# Patient Record
Sex: Female | Born: 1935 | Race: White | Hispanic: No | Smoking: Never smoker
Health system: Southern US, Community
[De-identification: ages and names within clinical notes are randomized; demographics above are authoritative.]

## PROBLEM LIST (undated history)

## (undated) DIAGNOSIS — M1990 Unspecified osteoarthritis, unspecified site: Secondary | ICD-10-CM

## (undated) DIAGNOSIS — Z923 Personal history of irradiation: Secondary | ICD-10-CM

## (undated) DIAGNOSIS — I10 Essential (primary) hypertension: Secondary | ICD-10-CM

## (undated) DIAGNOSIS — C50919 Malignant neoplasm of unspecified site of unspecified female breast: Secondary | ICD-10-CM

## (undated) DIAGNOSIS — E785 Hyperlipidemia, unspecified: Secondary | ICD-10-CM

## (undated) DIAGNOSIS — C801 Malignant (primary) neoplasm, unspecified: Secondary | ICD-10-CM

## (undated) DIAGNOSIS — F0390 Unspecified dementia without behavioral disturbance: Secondary | ICD-10-CM

## (undated) DIAGNOSIS — Z853 Personal history of malignant neoplasm of breast: Secondary | ICD-10-CM

## (undated) DIAGNOSIS — E079 Disorder of thyroid, unspecified: Secondary | ICD-10-CM

## (undated) DIAGNOSIS — G459 Transient cerebral ischemic attack, unspecified: Secondary | ICD-10-CM

## (undated) DIAGNOSIS — Z9221 Personal history of antineoplastic chemotherapy: Secondary | ICD-10-CM

## (undated) DIAGNOSIS — D6862 Lupus anticoagulant syndrome: Secondary | ICD-10-CM

## (undated) DIAGNOSIS — I639 Cerebral infarction, unspecified: Secondary | ICD-10-CM

## (undated) HISTORY — DX: Hyperlipidemia, unspecified: E78.5

## (undated) HISTORY — DX: Cerebral infarction, unspecified: I63.9

## (undated) HISTORY — DX: Essential (primary) hypertension: I10

## (undated) HISTORY — PX: FINGER HARDWARE REMOVAL: SHX1637

## (undated) HISTORY — DX: Personal history of malignant neoplasm of breast: Z85.3

## (undated) HISTORY — DX: Unspecified dementia, unspecified severity, without behavioral disturbance, psychotic disturbance, mood disturbance, and anxiety: F03.90

## (undated) HISTORY — DX: Transient cerebral ischemic attack, unspecified: G45.9

## (undated) HISTORY — DX: Lupus anticoagulant syndrome: D68.62

## (undated) HISTORY — DX: Disorder of thyroid, unspecified: E07.9

## (undated) HISTORY — PX: REPLACEMENT TOTAL KNEE: SUR1224

## (undated) HISTORY — DX: Malignant (primary) neoplasm, unspecified: C80.1

## (undated) HISTORY — DX: Unspecified osteoarthritis, unspecified site: M19.90

---

## 1964-06-12 HISTORY — PX: THYROID SURGERY: SHX805

## 1967-06-13 HISTORY — PX: ABDOMINAL HYSTERECTOMY: SHX81

## 1999-06-13 DIAGNOSIS — C801 Malignant (primary) neoplasm, unspecified: Secondary | ICD-10-CM

## 1999-06-13 HISTORY — DX: Malignant (primary) neoplasm, unspecified: C80.1

## 1999-06-13 HISTORY — PX: BREAST SURGERY: SHX581

## 1999-06-13 HISTORY — PX: BREAST LUMPECTOMY: SHX2

## 1999-06-30 DIAGNOSIS — C50919 Malignant neoplasm of unspecified site of unspecified female breast: Secondary | ICD-10-CM

## 1999-06-30 HISTORY — DX: Malignant neoplasm of unspecified site of unspecified female breast: C50.919

## 2004-04-13 ENCOUNTER — Ambulatory Visit: Payer: Self-pay | Admitting: Oncology

## 2004-05-16 ENCOUNTER — Ambulatory Visit: Payer: Self-pay | Admitting: General Surgery

## 2004-11-09 ENCOUNTER — Ambulatory Visit: Payer: Self-pay | Admitting: Oncology

## 2005-05-10 ENCOUNTER — Ambulatory Visit: Payer: Self-pay | Admitting: Oncology

## 2005-05-12 ENCOUNTER — Ambulatory Visit: Payer: Self-pay | Admitting: Oncology

## 2005-05-19 ENCOUNTER — Ambulatory Visit: Payer: Self-pay | Admitting: General Surgery

## 2005-05-30 ENCOUNTER — Ambulatory Visit: Payer: Self-pay | Admitting: General Surgery

## 2005-11-07 ENCOUNTER — Ambulatory Visit: Payer: Self-pay | Admitting: Oncology

## 2005-11-10 ENCOUNTER — Ambulatory Visit: Payer: Self-pay | Admitting: Oncology

## 2006-05-08 ENCOUNTER — Ambulatory Visit: Payer: Self-pay | Admitting: Oncology

## 2006-05-12 ENCOUNTER — Ambulatory Visit: Payer: Self-pay | Admitting: Oncology

## 2006-05-14 ENCOUNTER — Ambulatory Visit: Payer: Self-pay | Admitting: General Surgery

## 2006-10-11 ENCOUNTER — Ambulatory Visit: Payer: Self-pay | Admitting: Oncology

## 2006-10-30 ENCOUNTER — Ambulatory Visit: Payer: Self-pay | Admitting: Oncology

## 2006-11-11 ENCOUNTER — Ambulatory Visit: Payer: Self-pay | Admitting: Oncology

## 2007-04-13 ENCOUNTER — Ambulatory Visit: Payer: Self-pay | Admitting: Oncology

## 2007-05-06 ENCOUNTER — Ambulatory Visit: Payer: Self-pay | Admitting: Oncology

## 2007-05-13 ENCOUNTER — Ambulatory Visit: Payer: Self-pay | Admitting: Oncology

## 2007-05-13 ENCOUNTER — Ambulatory Visit: Payer: Self-pay | Admitting: General Surgery

## 2007-06-13 HISTORY — PX: FINGER SURGERY: SHX640

## 2007-06-13 HISTORY — PX: WRIST SURGERY: SHX841

## 2007-11-11 ENCOUNTER — Ambulatory Visit: Payer: Self-pay | Admitting: Oncology

## 2007-12-05 ENCOUNTER — Inpatient Hospital Stay: Payer: Self-pay | Admitting: Specialist

## 2007-12-05 ENCOUNTER — Other Ambulatory Visit: Payer: Self-pay

## 2007-12-10 ENCOUNTER — Ambulatory Visit: Payer: Self-pay | Admitting: Oncology

## 2007-12-11 ENCOUNTER — Ambulatory Visit: Payer: Self-pay | Admitting: Oncology

## 2008-01-11 ENCOUNTER — Ambulatory Visit: Payer: Self-pay | Admitting: Oncology

## 2008-01-20 ENCOUNTER — Ambulatory Visit: Payer: Self-pay | Admitting: Oncology

## 2008-02-11 ENCOUNTER — Ambulatory Visit: Payer: Self-pay | Admitting: Oncology

## 2008-05-12 ENCOUNTER — Ambulatory Visit: Payer: Self-pay | Admitting: Oncology

## 2008-05-13 ENCOUNTER — Ambulatory Visit: Payer: Self-pay | Admitting: General Surgery

## 2008-05-19 ENCOUNTER — Ambulatory Visit: Payer: Self-pay | Admitting: Oncology

## 2008-05-25 ENCOUNTER — Ambulatory Visit: Payer: Self-pay | Admitting: General Surgery

## 2008-06-12 ENCOUNTER — Ambulatory Visit: Payer: Self-pay | Admitting: Oncology

## 2008-06-12 HISTORY — PX: EYE SURGERY: SHX253

## 2008-10-10 ENCOUNTER — Ambulatory Visit: Payer: Self-pay | Admitting: Oncology

## 2008-11-03 ENCOUNTER — Ambulatory Visit: Payer: Self-pay | Admitting: Oncology

## 2008-11-10 ENCOUNTER — Ambulatory Visit: Payer: Self-pay | Admitting: Oncology

## 2009-01-09 ENCOUNTER — Inpatient Hospital Stay: Payer: Self-pay | Admitting: Internal Medicine

## 2009-01-26 ENCOUNTER — Ambulatory Visit: Payer: Self-pay | Admitting: Oncology

## 2009-02-10 ENCOUNTER — Ambulatory Visit: Payer: Self-pay | Admitting: Oncology

## 2009-04-12 ENCOUNTER — Ambulatory Visit: Payer: Self-pay | Admitting: Oncology

## 2009-04-20 ENCOUNTER — Ambulatory Visit: Payer: Self-pay | Admitting: Oncology

## 2009-05-12 ENCOUNTER — Ambulatory Visit: Payer: Self-pay | Admitting: Oncology

## 2009-06-12 HISTORY — PX: COLONOSCOPY: SHX174

## 2009-06-17 ENCOUNTER — Ambulatory Visit: Payer: Self-pay | Admitting: General Surgery

## 2009-07-07 ENCOUNTER — Ambulatory Visit: Payer: Self-pay | Admitting: Gastroenterology

## 2009-07-11 ENCOUNTER — Emergency Department: Payer: Self-pay | Admitting: Unknown Physician Specialty

## 2009-10-10 ENCOUNTER — Ambulatory Visit: Payer: Self-pay | Admitting: Oncology

## 2009-10-21 ENCOUNTER — Emergency Department: Payer: Self-pay | Admitting: Emergency Medicine

## 2009-11-02 ENCOUNTER — Ambulatory Visit: Payer: Self-pay | Admitting: Oncology

## 2009-11-10 ENCOUNTER — Ambulatory Visit: Payer: Self-pay | Admitting: Oncology

## 2010-05-04 ENCOUNTER — Ambulatory Visit: Payer: Self-pay | Admitting: Oncology

## 2010-05-06 LAB — CANCER ANTIGEN 27.29: CA 27.29: 15.7 U/mL (ref 0.0–38.6)

## 2010-05-12 ENCOUNTER — Ambulatory Visit: Payer: Self-pay | Admitting: Oncology

## 2010-05-15 ENCOUNTER — Ambulatory Visit: Payer: Self-pay | Admitting: Internal Medicine

## 2010-05-16 ENCOUNTER — Ambulatory Visit: Payer: Self-pay | Admitting: Oncology

## 2010-06-12 HISTORY — PX: SHOULDER SURGERY: SHX246

## 2010-06-23 ENCOUNTER — Ambulatory Visit: Payer: Self-pay | Admitting: Oncology

## 2010-07-29 IMAGING — CT CT HEAD WITHOUT CONTRAST
2 series · 16 of 30 positions shown, 20 images · non-contrast
Comparison: none

REASON FOR EXAM: facial drooping/headache..pt in [place]
COMMENTS:   LMP: Post Hysterectomy

PROCEDURE:     CT  - CT HEAD WITHOUT CONTRAST  - October 21, 2009  [DATE]
RESULT:     Head CT dated 10/21/2009 comparison made to prior study dated
07/12/2009.
TECHNIQUE: Helical 5 mm sections were obtained from the skull base to the
vertex without administration of intravenous contrast.

[Series 2: without · axial · non-contrast · 0.39mm/px · z∈[-156,-36]mm · 13 of 29 slices shown, 17 images]
[im 3/29  brain]
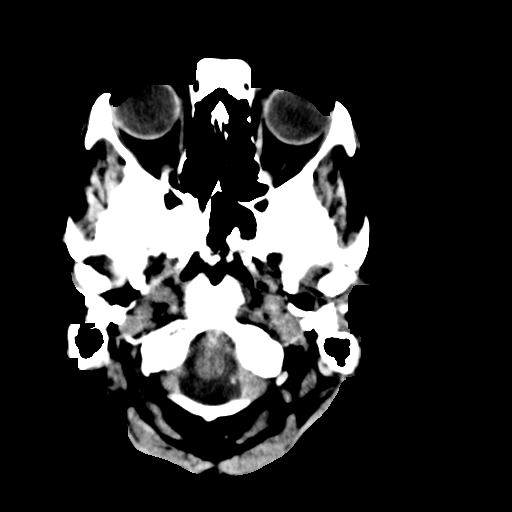
[im 3/29  bone]
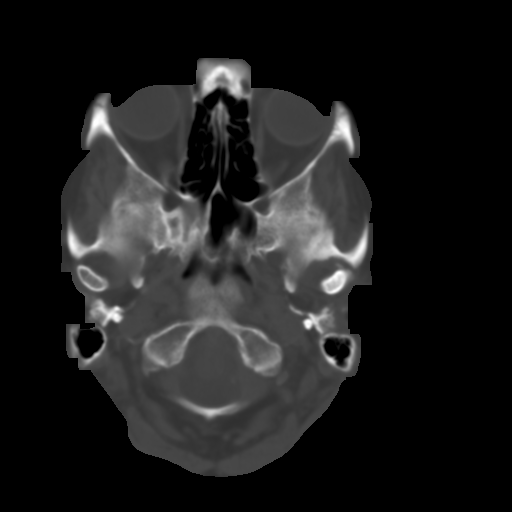
[im 5/29  brain]
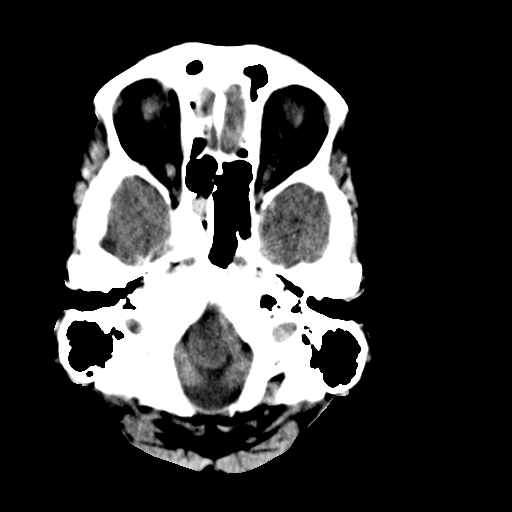
[im 7/29  brain]
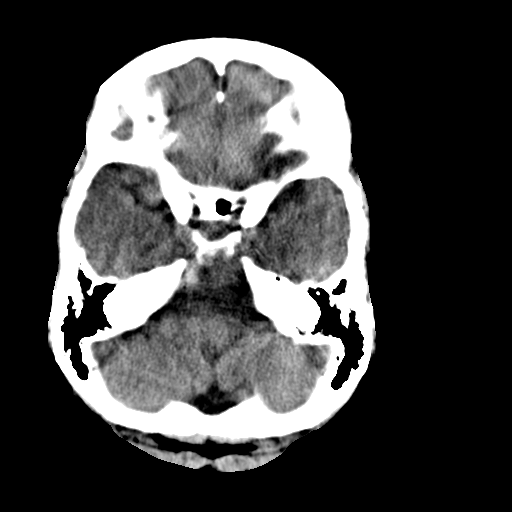
[im 9/29  brain]
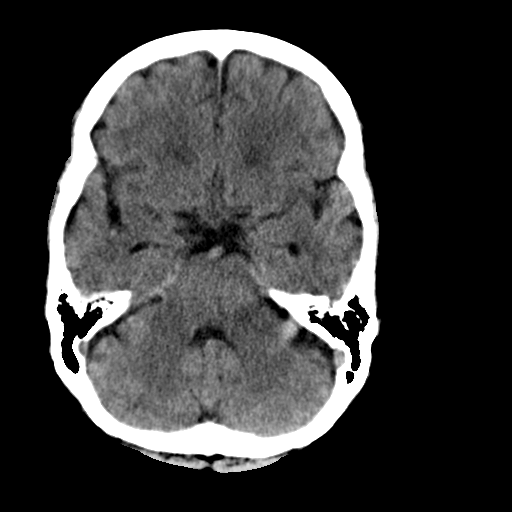
[im 11/29  brain]
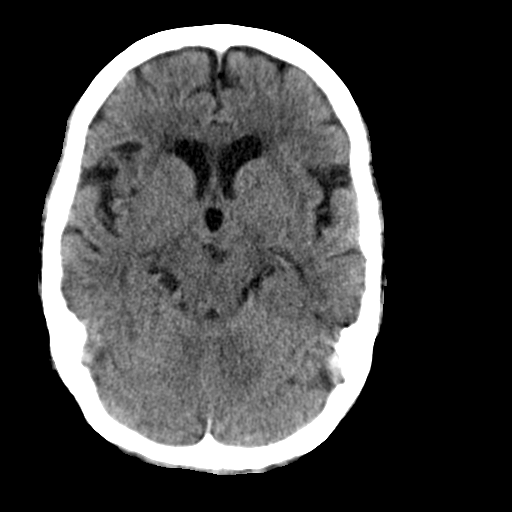
[im 11/29  bone]
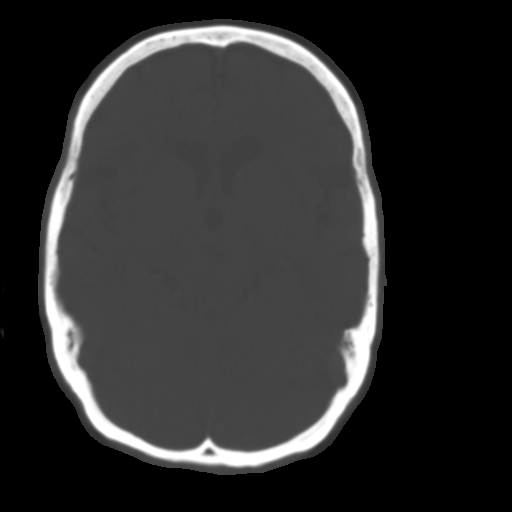
[im 13/29  brain]
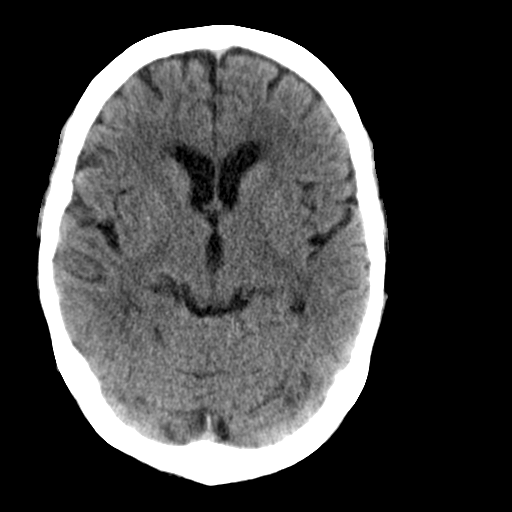
[im 15/29  brain]
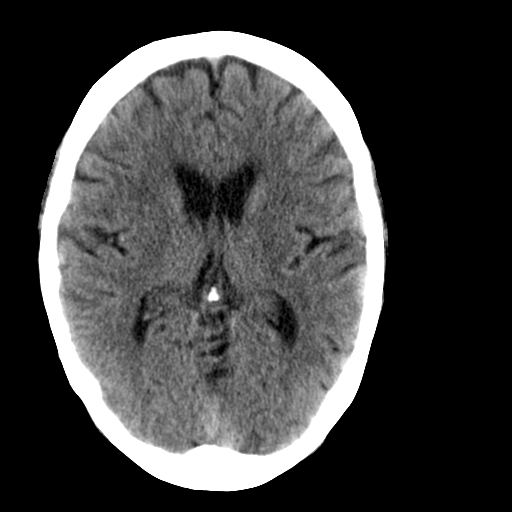
[im 17/29  brain]
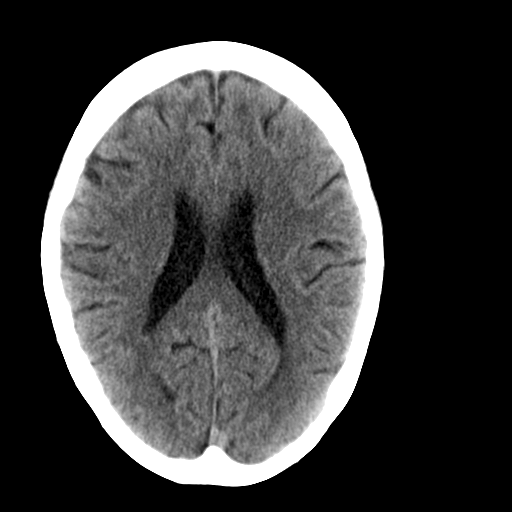
[im 19/29  brain]
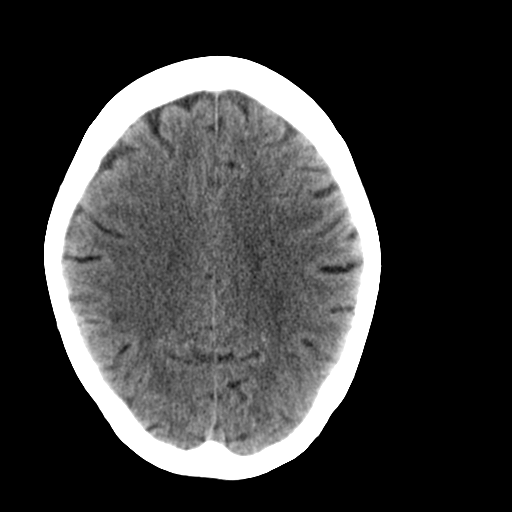
[im 19/29  bone]
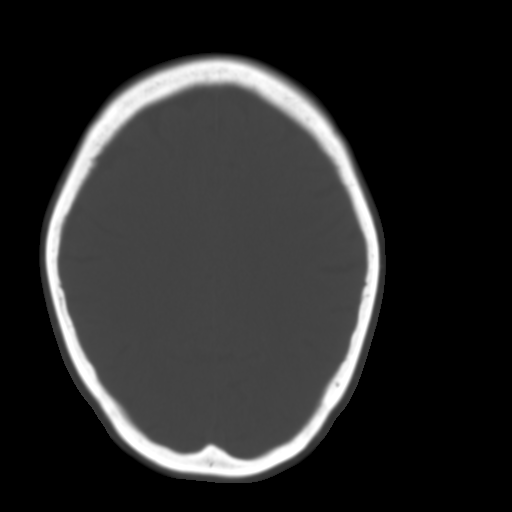
[im 21/29  brain]
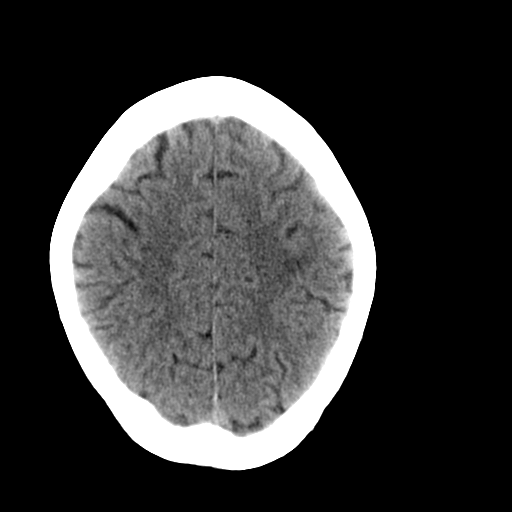
[im 23/29  brain]
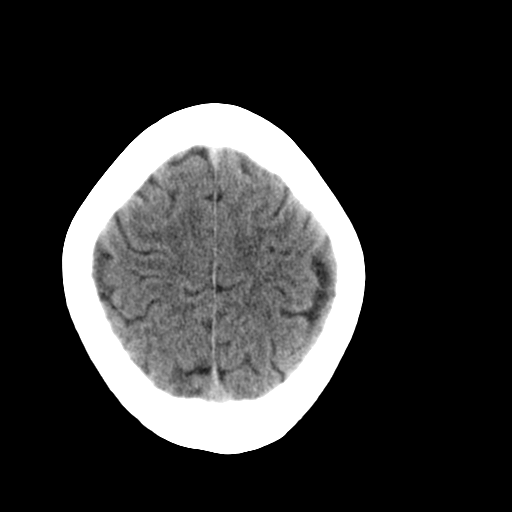
[im 25/29  brain]
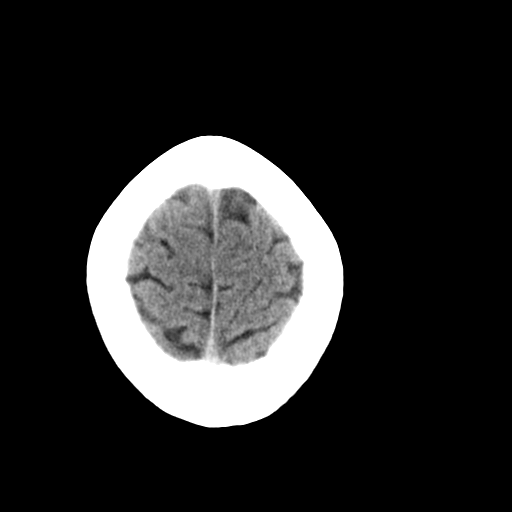
[im 27/29  brain]
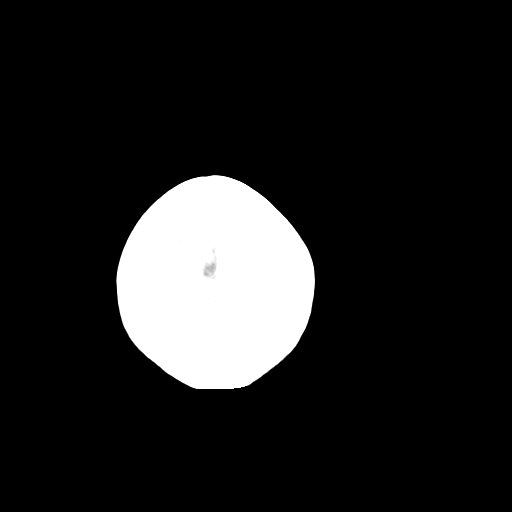
[im 27/29  bone]
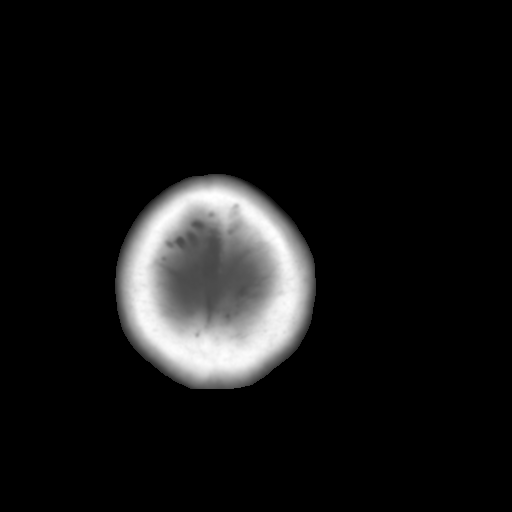

[Series 3: bone · axial · 0.39mm/px · z∈[-156,-116]mm · 3 of 29 slices shown]
[im 3/29  bone]
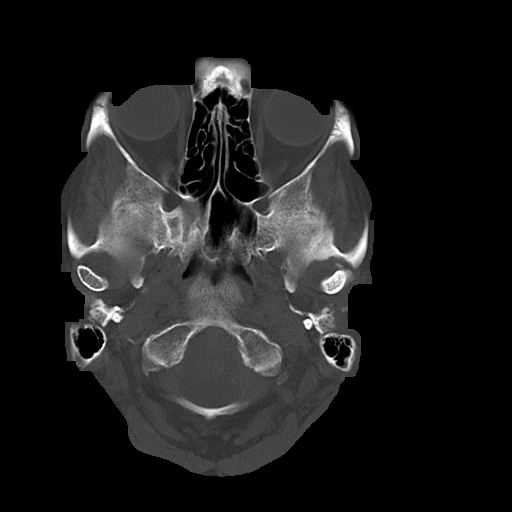
[im 7/29  bone]
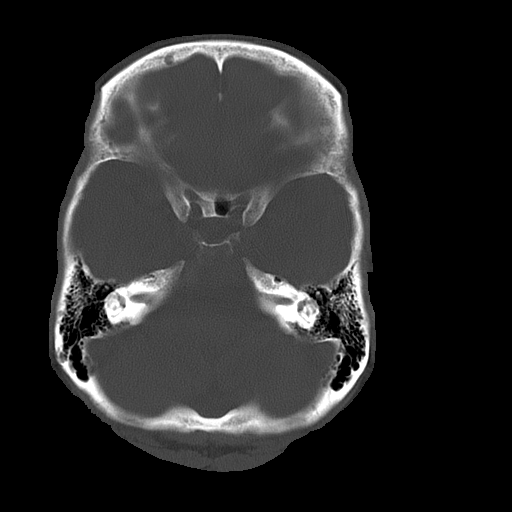
[im 11/29  bone]
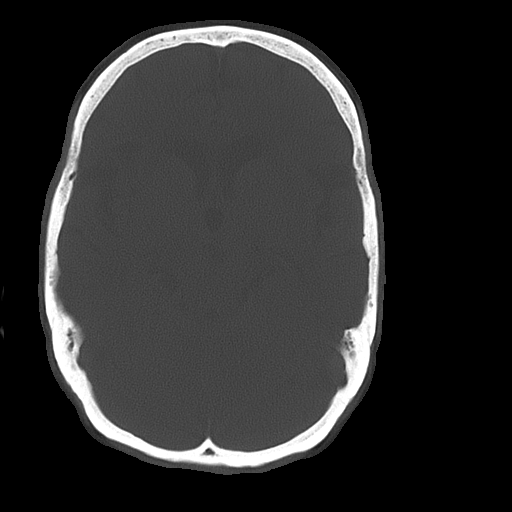

[16 of 30 positions shown; findings below may reference images not displayed]

FINDINGS: There is no evidence of intra-axial nor extra-axial fluid
collections nor evidence of acute hemorrhage. Areas of low attenuation
project within the subcortical, deep and periventricular white matter
regions. There is no evidence of subfalcine or tonsillar herniation. The
ventricles and cisterns are patent. There is no evidence of a depressed
skull fracture.
IMPRESSION: Involutional changes without evidence of focal or acute
abnormalities.

## 2011-01-18 ENCOUNTER — Ambulatory Visit: Payer: Self-pay | Admitting: Rheumatology

## 2011-05-10 ENCOUNTER — Ambulatory Visit: Payer: Self-pay | Admitting: Oncology

## 2011-06-13 DIAGNOSIS — I639 Cerebral infarction, unspecified: Secondary | ICD-10-CM

## 2011-06-13 HISTORY — DX: Cerebral infarction, unspecified: I63.9

## 2011-06-21 ENCOUNTER — Emergency Department: Payer: Self-pay | Admitting: Emergency Medicine

## 2011-06-21 LAB — CBC WITH DIFFERENTIAL/PLATELET
Basophil %: 0.4 %
Eosinophil %: 1.4 %
HGB: 12 g/dL (ref 12.0–16.0)
Lymphocyte #: 1.2 10*3/uL (ref 1.0–3.6)
Lymphocyte %: 16.1 %
MCV: 84 fL (ref 80–100)
Monocyte #: 0.9 10*3/uL — ABNORMAL HIGH (ref 0.0–0.7)
Monocyte %: 12.3 %
Neutrophil #: 5.2 10*3/uL (ref 1.4–6.5)
Neutrophil %: 69.8 %
Platelet: 208 10*3/uL (ref 150–440)
RBC: 4.37 10*6/uL (ref 3.80–5.20)
RDW: 14.4 % (ref 11.5–14.5)
WBC: 7.4 10*3/uL (ref 3.6–11.0)

## 2011-06-21 LAB — PROTIME-INR
INR: 3.1
Prothrombin Time: 32.3 secs — ABNORMAL HIGH (ref 11.5–14.7)

## 2011-06-21 LAB — COMPREHENSIVE METABOLIC PANEL
Alkaline Phosphatase: 55 U/L (ref 50–136)
Anion Gap: 6 — ABNORMAL LOW (ref 7–16)
Calcium, Total: 9.5 mg/dL (ref 8.5–10.1)
Co2: 28 mmol/L (ref 21–32)
EGFR (Non-African Amer.): >60
Osmolality: 268 (ref 275–301)
Potassium: 4.2 mmol/L (ref 3.5–5.1)
SGPT (ALT): 24 U/L
Sodium: 133 mmol/L — ABNORMAL LOW (ref 136–145)

## 2011-06-21 LAB — URIC ACID: Uric Acid: 2.4 mg/dL — ABNORMAL LOW (ref 2.6–6.0)

## 2011-06-21 LAB — SEDIMENTATION RATE: Erythrocyte Sed Rate: 21 mm/hr (ref 0–30)

## 2011-06-27 ENCOUNTER — Ambulatory Visit: Payer: Self-pay | Admitting: General Surgery

## 2011-07-28 ENCOUNTER — Ambulatory Visit: Payer: Self-pay | Admitting: Oncology

## 2011-07-28 LAB — CBC CANCER CENTER
Basophil #: 0.2 x10 3/mm — ABNORMAL HIGH (ref 0.0–0.1)
Eosinophil #: 0.3 x10 3/mm (ref 0.0–0.7)
Eosinophil %: 4.2 %
HCT: 40.5 % (ref 35.0–47.0)
HGB: 12.7 g/dL (ref 12.0–16.0)
Lymphocyte %: 16.1 %
MCH: 26.8 pg (ref 26.0–34.0)
MCHC: 31.4 g/dL — ABNORMAL LOW (ref 32.0–36.0)
MCV: 85.3 fL (ref 80–100)
Monocyte %: 8.6 %
Neutrophil #: 4.1 x10 3/mm (ref 1.4–6.5)
RDW: 15 % — ABNORMAL HIGH (ref 11.5–14.5)
WBC: 6.1 x10 3/mm (ref 3.6–11.0)

## 2011-07-28 LAB — COMPREHENSIVE METABOLIC PANEL
Alkaline Phosphatase: 74 U/L (ref 50–136)
Anion Gap: 2 — ABNORMAL LOW (ref 7–16)
BUN: 16 mg/dL (ref 7–18)
Bilirubin,Total: 0.5 mg/dL (ref 0.2–1.0)
Chloride: 99 mmol/L (ref 98–107)
Co2: 33 mmol/L — ABNORMAL HIGH (ref 21–32)
EGFR (African American): >60
EGFR (Non-African Amer.): >60
SGOT(AST): 26 U/L (ref 15–37)
SGPT (ALT): 26 U/L
Total Protein: 7.4 g/dL (ref 6.4–8.2)

## 2011-08-01 LAB — CEA: CEA: 3.3 ng/mL (ref 0.0–4.7)

## 2011-08-01 LAB — CANCER ANTIGEN 19-9: CA 19-9: <1 U/mL (ref 0–35)

## 2011-08-11 ENCOUNTER — Ambulatory Visit: Payer: Self-pay | Admitting: Oncology

## 2011-09-10 ENCOUNTER — Emergency Department: Payer: Self-pay | Admitting: Emergency Medicine

## 2011-09-10 LAB — BASIC METABOLIC PANEL
Anion Gap: 8 (ref 7–16)
BUN: 17 mg/dL (ref 7–18)
Creatinine: 0.68 mg/dL (ref 0.60–1.30)
EGFR (African American): >60
Glucose: 86 mg/dL (ref 65–99)

## 2011-09-10 LAB — CBC
HCT: 36.5 % (ref 35.0–47.0)
HGB: 12 g/dL (ref 12.0–16.0)
MCH: 27.7 pg (ref 26.0–34.0)
WBC: 6.3 10*3/uL (ref 3.6–11.0)

## 2011-09-10 LAB — PROTIME-INR: INR: 1.6

## 2011-12-29 ENCOUNTER — Ambulatory Visit: Payer: Self-pay | Admitting: Family Medicine

## 2012-06-27 ENCOUNTER — Ambulatory Visit: Payer: Self-pay | Admitting: Oncology

## 2012-12-20 ENCOUNTER — Encounter: Payer: Self-pay | Admitting: *Deleted

## 2013-07-22 ENCOUNTER — Encounter: Payer: Self-pay | Admitting: General Surgery

## 2013-07-22 ENCOUNTER — Ambulatory Visit: Payer: Self-pay | Admitting: General Surgery

## 2013-07-30 ENCOUNTER — Ambulatory Visit: Payer: Self-pay | Admitting: General Surgery

## 2013-08-04 ENCOUNTER — Ambulatory Visit: Payer: Self-pay | Admitting: General Surgery

## 2013-08-14 ENCOUNTER — Ambulatory Visit (INDEPENDENT_AMBULATORY_CARE_PROVIDER_SITE_OTHER): Payer: Medicare Other | Admitting: General Surgery

## 2013-08-14 ENCOUNTER — Encounter: Payer: Self-pay | Admitting: General Surgery

## 2013-08-14 VITALS — BP 100/76 | HR 64 | Resp 12 | Ht 63.0 in | Wt 113.0 lb

## 2013-08-14 DIAGNOSIS — Z853 Personal history of malignant neoplasm of breast: Secondary | ICD-10-CM

## 2013-08-14 NOTE — Patient Instructions (Signed)
Patient to return in 1 year with bilateral screening mammogram. Continue self breast exams. Call office for any new breast issues or concerns.  

## 2013-08-14 NOTE — Progress Notes (Signed)
Patient ID: Theresa Sanchez, female   DOB: 06-27-1931, 80 y.o.   MRN: [id number]  Chief Complaint  Patient presents with  . Follow-up    1 year follow up bilateral screening mammogram. History of breast cancer    HPI Theresa Sanchez is a 80 y.o. female who presents for a 1 year follow up bilateral screening mammogram. History of left breast cancer diagnosed in 2001. who presents for a breast evaluation. The most recent mammogram was done on 07/22/13. Patient does perform regular self breast checks and gets regular mammograms done. No new problems with her breasts at this time.     HPI  Past Medical History  Diagnosis Date  . Hypertension   . Stroke 2013  . Arthritis   . Personal history of malignant neoplasm of breast   . Thyroid disease   . Hyperlipidemia   . Lupus anticoagulant disorder   . Cancer 2001    left breast Invasive Cancer ER/PR positive Her 2 negative.    Past Surgical History  Procedure Laterality Date  . Eye surgery  2010  . Finger surgery  2009  . Wrist surgery  2009  . Finger hardware removal    . Replacement total knee Bilateral [id number]  . Thyroid surgery  1966  . Breast surgery Left 2001    lumpectomy  . Colonoscopy  2011  . Shoulder surgery Left 2012  . Abdominal hysterectomy  1969    Family History  Problem Relation Age of Onset  . Cancer Brother     colon  . Cancer Sister     colon  . Cancer Paternal Aunt     breast  . Cancer Paternal Aunt     breast    Social History History  Substance Use Topics  . Smoking status: Never Smoker   . Smokeless tobacco: Never Used  . Alcohol Use: No    Allergies  Allergen Reactions  . Tape Rash  . Vicodin [Hydrocodone-Acetaminophen] Rash    Current Outpatient Prescriptions  Medication Sig Dispense Refill  . amLODipine (NORVASC) 10 MG tablet Take 10 mg by mouth daily.      . benazepril (LOTENSIN) 40 MG tablet Take 40 mg by mouth daily.      . hydroxychloroquine (PLAQUENIL) 200 MG  tablet Take 200 mg by mouth daily.      . Hypromellose (GENTEAL) 0.3 % SOLN Apply 1-2 drops to eye Nightly.      . levothyroxine (SYNTHROID, LEVOTHROID) 100 MCG tablet Take 100 mcg by mouth daily before breakfast.      . LIPITOR 10 MG tablet Take 1 tablet by mouth daily.      Marland Kitchen oxycodone (OXY-IR) 5 MG capsule Take 5 mg by mouth every 4 (four) hours as needed.      Marland Kitchen PRILOSEC 20 MG capsule       . tobramycin-dexamethasone (TOBRADEX) ophthalmic solution Place 1 drop into both eyes every 4 (four) hours while awake.      . traMADol (ULTRAM) 50 MG tablet Take 50 mg by mouth every 6 (six) hours as needed.       No current facility-administered medications for this visit.    Review of Systems Review of Systems  Constitutional: Negative.   Respiratory: Negative.   Cardiovascular: Negative.     Blood pressure 100/76, pulse 64, resp. rate 12, height 5\' 3"  (1.6 m), weight 113 lb (51.256 kg).  Physical Exam Physical Exam  Constitutional: She is oriented to person, place, and time. She appears  well-developed and well-nourished.  Eyes: Conjunctivae are normal. No scleral icterus.  Neck: Neck supple. No thyromegaly present.  Cardiovascular: Normal rate, regular rhythm and normal heart sounds.   No murmur heard. Pulmonary/Chest: Effort normal and breath sounds normal. Right breast exhibits no inverted nipple, no mass, no nipple discharge, no skin change and no tenderness. Left breast exhibits no inverted nipple, no mass, no nipple discharge, no skin change and no tenderness.  Lymphadenopathy:    She has no cervical adenopathy.    She has no axillary adenopathy.  Neurological: She is alert and oriented to person, place, and time.  Skin: Skin is warm and dry.    Data Reviewed  Mammogram stable.   Assessment    Stable exam. 14 years post left breast cancer treatment.     Plan    Patient to return in 1 year with bilateral screening mammogram       SANKAR,SEEPLAPUTHUR G 08/15/2013, 6:41  AM

## 2013-08-15 ENCOUNTER — Encounter: Payer: Self-pay | Admitting: General Surgery

## 2013-08-15 DIAGNOSIS — Z853 Personal history of malignant neoplasm of breast: Secondary | ICD-10-CM | POA: Insufficient documentation

## 2014-04-13 ENCOUNTER — Encounter: Payer: Self-pay | Admitting: General Surgery

## 2014-08-04 ENCOUNTER — Ambulatory Visit: Payer: Self-pay | Admitting: General Surgery

## 2014-08-07 ENCOUNTER — Encounter: Payer: Self-pay | Admitting: General Surgery

## 2014-08-13 ENCOUNTER — Encounter: Payer: Self-pay | Admitting: General Surgery

## 2014-08-13 ENCOUNTER — Ambulatory Visit (INDEPENDENT_AMBULATORY_CARE_PROVIDER_SITE_OTHER): Payer: Medicare Other | Admitting: General Surgery

## 2014-08-13 VITALS — BP 120/68 | HR 66 | Resp 14 | Ht 63.0 in | Wt 122.0 lb

## 2014-08-13 DIAGNOSIS — Z853 Personal history of malignant neoplasm of breast: Secondary | ICD-10-CM | POA: Diagnosis not present

## 2014-08-13 NOTE — Patient Instructions (Signed)
Patient will be asked to return to the office in one year with a bilateral screening mammogram. 

## 2014-08-13 NOTE — Progress Notes (Signed)
Patient ID: Theresa Sanchez, female   DOB: [birth year], 81 y.o.   MRN: [id number]  Chief Complaint  Patient presents with  . Follow-up    mammogram    HPI Theresa Sanchez is a 81 y.o. female who presents for a breast cancer f/ollow up. The most recent mammogram was done on 08/04/14.  Patient does perform regular self breast checks and gets regular mammograms done.    HPI  Past Medical History  Diagnosis Date  . Hypertension   . Stroke 2013  . Arthritis   . Personal history of malignant neoplasm of breast   . Thyroid disease   . Hyperlipidemia   . Lupus anticoagulant disorder   . Cancer 2001    left breast Invasive Cancer ER/PR positive Her 2 negative.    Past Surgical History  Procedure Laterality Date  . Eye surgery  2010  . Finger surgery  2009  . Wrist surgery  2009  . Finger hardware removal    . Replacement total knee Bilateral [id number]  . Thyroid surgery  1966  . Colonoscopy  2011  . Shoulder surgery Left 2012  . Abdominal hysterectomy  1969  . Breast surgery Left 2001    lumpectomy    Family History  Problem Relation Age of Onset  . Cancer Brother     colon  . Cancer Sister     colon  . Cancer Paternal Aunt     breast  . Cancer Paternal Aunt     breast    Social History History  Substance Use Topics  . Smoking status: Never Smoker   . Smokeless tobacco: Never Used  . Alcohol Use: No    Allergies  Allergen Reactions  . Tape Rash  . Vicodin [Hydrocodone-Acetaminophen] Rash    Current Outpatient Prescriptions  Medication Sig Dispense Refill  . amLODipine (NORVASC) 10 MG tablet Take 10 mg by mouth daily.    . benazepril (LOTENSIN) 40 MG tablet Take 40 mg by mouth daily.    . hydroxychloroquine (PLAQUENIL) 200 MG tablet Take 200 mg by mouth daily.    . Hypromellose (GENTEAL) 0.3 % SOLN Apply 1-2 drops to eye Nightly.    . levothyroxine (SYNTHROID, LEVOTHROID) 100 MCG tablet Take 100 mcg by mouth daily before breakfast.    .  LIPITOR 10 MG tablet Take 1 tablet by mouth daily.    Marland Kitchen oxycodone (OXY-IR) 5 MG capsule Take 5 mg by mouth every 4 (four) hours as needed.    Marland Kitchen PRILOSEC 20 MG capsule     . tobramycin-dexamethasone (TOBRADEX) ophthalmic solution Place 1 drop into both eyes every 4 (four) hours while awake.    . traMADol (ULTRAM) 50 MG tablet Take 50 mg by mouth every 6 (six) hours as needed.     No current facility-administered medications for this visit.    Review of Systems Review of Systems  Constitutional: Negative.   Respiratory: Negative.   Cardiovascular: Negative.     Blood pressure 120/68, pulse 66, resp. rate 14, height 5\' 3"  (1.6 m), weight 122 lb (55.339 kg).  Physical Exam Physical Exam  Constitutional: She is oriented to person, place, and time. She appears well-developed and well-nourished.  Eyes: Conjunctivae are normal. No scleral icterus.  Neck: Neck supple.  Cardiovascular: Normal rate, regular rhythm and normal heart sounds.   Pulmonary/Chest: Effort normal and breath sounds normal. Right breast exhibits no inverted nipple, no mass, no nipple discharge, no skin change and no tenderness. Left breast exhibits no inverted  nipple, no mass, no nipple discharge, no skin change and no tenderness.  Abdominal: Soft. Normal appearance and bowel sounds are normal. There is no hepatomegaly. There is no tenderness.  Lymphadenopathy:    She has no cervical adenopathy.    She has no axillary adenopathy.  Neurological: She is alert and oriented to person, place, and time.  Skin: Skin is warm and dry.    Data Reviewed Mammogram reviewed and stable  Assessment    Stable exam, Pt is 72yrs posrt left breast lumpectomy for stage 1 CA. Doing well.      Plan    Patient will be asked to return to the office in one year with a bilateral screening mammogram. Continue self breast exams. Call office for any new breast issues or concerns.        Decker Cogdell G 08/13/2014, 2:27  PM

## 2015-05-27 ENCOUNTER — Other Ambulatory Visit: Payer: Self-pay | Admitting: *Deleted

## 2015-05-27 DIAGNOSIS — Z1239 Encounter for other screening for malignant neoplasm of breast: Secondary | ICD-10-CM

## 2015-07-14 DIAGNOSIS — G459 Transient cerebral ischemic attack, unspecified: Secondary | ICD-10-CM

## 2015-07-14 HISTORY — DX: Transient cerebral ischemic attack, unspecified: G45.9

## 2015-08-10 ENCOUNTER — Ambulatory Visit
Admission: RE | Admit: 2015-08-10 | Discharge: 2015-08-10 | Disposition: A | Discharge status: Home / Self Care | Payer: Medicare Other | Source: Ambulatory Visit | Attending: General Surgery | Admitting: General Surgery

## 2015-08-10 DIAGNOSIS — Z1231 Encounter for screening mammogram for malignant neoplasm of breast: Secondary | ICD-10-CM | POA: Diagnosis not present

## 2015-08-10 DIAGNOSIS — Z1239 Encounter for other screening for malignant neoplasm of breast: Secondary | ICD-10-CM

## 2015-08-10 HISTORY — DX: Malignant neoplasm of unspecified site of unspecified female breast: C50.919

## 2015-08-17 ENCOUNTER — Encounter: Payer: Self-pay | Admitting: General Surgery

## 2015-08-17 ENCOUNTER — Ambulatory Visit (INDEPENDENT_AMBULATORY_CARE_PROVIDER_SITE_OTHER): Payer: Medicare Other | Admitting: General Surgery

## 2015-08-17 VITALS — BP 112/68 | HR 62 | Resp 14 | Ht 61.0 in | Wt 117.0 lb

## 2015-08-17 DIAGNOSIS — Z853 Personal history of malignant neoplasm of breast: Secondary | ICD-10-CM

## 2015-08-17 NOTE — Progress Notes (Signed)
Patient ID: Cristino Martes, female   DOB: Jul 11, 1931, 82 y.o.   MRN: [id number]  Chief Complaint  Patient presents with  . Follow-up    mammogram    HPI Demekia Rezek is a 82 y.o. female.  who presents for follow up breast cancer and breast evaluation. The most recent mammogram was done on 08-10-15.  Patient does perform regular self breast checks and gets regular mammograms done.  No new breast issues. She is here toady with her son. She did have a TIA at the end of last month and has recovered well. I have reviewed the history of present illness with the patient.    HPI  Past Medical History  Diagnosis Date  . Hypertension   . Stroke [facility]) 2013  . Arthritis   . Personal history of malignant neoplasm of breast   . Thyroid disease   . Hyperlipidemia   . Lupus anticoagulant disorder ([place])   . Cancer [facility]) 2001    left breast Invasive Cancer ER/PR positive Her 2 negative.  . Breast cancer ([place]) 06/30/1999    lt lumpectomy/ chemo /rad  . TIA (transient ischemic attack) Feb 2017  . Dementia     Past Surgical History  Procedure Laterality Date  . Eye surgery  2010  . Finger surgery  2009  . Wrist surgery  2009  . Finger hardware removal    . Replacement total knee Bilateral [id number]  . Thyroid surgery  1966  . Colonoscopy  2011  . Shoulder surgery Left 2012  . Abdominal hysterectomy  1969  . Breast surgery Left 2001    lumpectomy    Family History  Problem Relation Age of Onset  . Cancer Brother     colon  . Cancer Sister     colon  . Cancer Paternal Aunt     breast  . Breast cancer Paternal Aunt 21  . Cancer Paternal Aunt     breast    Social History Social History  Substance Use Topics  . Smoking status: Never Smoker   . Smokeless tobacco: Never Used  . Alcohol Use: No    Allergies  Allergen Reactions  . Tape Rash  . Vicodin [Hydrocodone-Acetaminophen] Rash    Current Outpatient Prescriptions  Medication Sig Dispense  Refill  . amLODipine (NORVASC) 10 MG tablet Take 10 mg by mouth daily.    Marland Kitchen aspirin 81 MG tablet Take 81 mg by mouth daily.    . benazepril (LOTENSIN) 40 MG tablet Take 40 mg by mouth daily.    . Hypromellose (GENTEAL) 0.3 % SOLN Apply 1-2 drops to eye Nightly.    . levothyroxine (SYNTHROID, LEVOTHROID) 100 MCG tablet Take 100 mcg by mouth daily before breakfast.    . LIPITOR 10 MG tablet Take 1 tablet by mouth daily.    . ranitidine (ZANTAC) 150 MG tablet Take 150 mg by mouth daily.    . Sennosides (MP SENALAX PO) Take 2 tablets by mouth daily.    Marland Kitchen tobramycin-dexamethasone (TOBRADEX) ophthalmic solution Place 1 drop into both eyes every 4 (four) hours while awake.     No current facility-administered medications for this visit.    Review of Systems Review of Systems  Constitutional: Negative.   Respiratory: Negative.   Cardiovascular: Negative.     Blood pressure 112/68, pulse 62, resp. rate 14, height 5\' 1"  (1.549 m), weight 117 lb (53.071 kg).  Physical Exam Physical Exam  Constitutional: She is oriented to person, place, and time. She appears  well-developed and well-nourished.  HENT:  Mouth/Throat: Oropharynx is clear and moist.  Eyes: Conjunctivae are normal. No scleral icterus.  Neck: Neck supple.  Cardiovascular: Normal rate and regular rhythm.   Pulmonary/Chest: Effort normal and breath sounds normal. Right breast exhibits no inverted nipple, no mass, no nipple discharge, no skin change and no tenderness. Left breast exhibits no inverted nipple, no mass, no nipple discharge, no skin change and no tenderness.  Minimal scarring on left breast from prior lumpectomy and radiation. Unchanged from before.   Abdominal: Soft. Normal appearance and bowel sounds are normal. There is no hepatomegaly. There is no tenderness.  Lymphadenopathy:    She has no cervical adenopathy.    She has no axillary adenopathy.  Neurological: She is alert and oriented to person, place, and time.   Skin: Skin is warm and dry.  Psychiatric: Her behavior is normal.    Data Reviewed Mammogram reviewed and stable  Assessment    Stable exam, Pt is 18 yrs post left breast lumpectomy for stage 1 CA. Doing well. She is currently living at home under care of her son. She is very limited in activity. Feel it is safe to do physical exam in 1 year and avoid mammogram. Pt and son are in agreement.     Plan   Patient will be asked to return to the office in one year. Continue self breast exams. Call office for any new breast issues or concerns.      PCP:  Michaell Cowing This information has been scribed by Karie Fetch RNBC.    SANKAR,SEEPLAPUTHUR G 08/17/2015, 10:49 AM

## 2015-08-17 NOTE — Patient Instructions (Addendum)
The patient is aware to call back for any questions or concerns. Patient will be asked to return to the office in one year

## 2015-12-28 ENCOUNTER — Encounter: Payer: Self-pay | Admitting: General Surgery

## 2016-08-10 ENCOUNTER — Encounter: Payer: Self-pay | Admitting: *Deleted

## 2016-08-16 ENCOUNTER — Ambulatory Visit: Payer: Medicare Other | Admitting: General Surgery

## 2016-09-18 ENCOUNTER — Other Ambulatory Visit: Payer: Self-pay | Admitting: General Surgery

## 2016-09-18 DIAGNOSIS — Z1231 Encounter for screening mammogram for malignant neoplasm of breast: Secondary | ICD-10-CM

## 2016-09-19 ENCOUNTER — Ambulatory Visit: Payer: Medicare Other | Admitting: General Surgery

## 2016-09-26 ENCOUNTER — Ambulatory Visit
Admission: RE | Admit: 2016-09-26 | Discharge: 2016-09-26 | Disposition: A | Discharge status: Home / Self Care | Payer: Medicare Other | Source: Ambulatory Visit | Attending: General Surgery | Admitting: General Surgery

## 2016-09-26 DIAGNOSIS — Z1231 Encounter for screening mammogram for malignant neoplasm of breast: Secondary | ICD-10-CM | POA: Diagnosis present

## 2016-09-26 HISTORY — DX: Personal history of antineoplastic chemotherapy: Z92.21

## 2016-09-26 HISTORY — DX: Personal history of irradiation: Z92.3

## 2016-10-18 ENCOUNTER — Encounter: Payer: Self-pay | Admitting: *Deleted

## 2023-02-11 DEATH — deceased
